# Patient Record
Sex: Female | Born: 1995 | Race: Black or African American | Hispanic: No | Marital: Single | Smoking: Never smoker
Health system: Southern US, Community
[De-identification: ages and names within clinical notes are randomized; demographics above are authoritative.]

---

## 2020-02-07 ENCOUNTER — Ambulatory Visit (HOSPITAL_COMMUNITY)
Admission: EM | Admit: 2020-02-07 | Discharge: 2020-02-07 | Disposition: A | Discharge status: Home / Self Care | Payer: PRIVATE HEALTH INSURANCE | Attending: Family Medicine | Admitting: Family Medicine

## 2020-02-07 ENCOUNTER — Ambulatory Visit (INDEPENDENT_AMBULATORY_CARE_PROVIDER_SITE_OTHER): Payer: PRIVATE HEALTH INSURANCE

## 2020-02-07 ENCOUNTER — Other Ambulatory Visit: Payer: Self-pay

## 2020-02-07 ENCOUNTER — Encounter (HOSPITAL_COMMUNITY): Payer: Self-pay

## 2020-02-07 DIAGNOSIS — M25462 Effusion, left knee: Secondary | ICD-10-CM

## 2020-02-07 DIAGNOSIS — M25562 Pain in left knee: Secondary | ICD-10-CM

## 2020-02-07 DIAGNOSIS — M25522 Pain in left elbow: Secondary | ICD-10-CM | POA: Diagnosis not present
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
